# Patient Record
Sex: Female | Born: 1990 | Race: White | Hispanic: No | Marital: Single | Smoking: Never smoker
Health system: Southern US, Community
[De-identification: ages and names within clinical notes are randomized; demographics above are authoritative.]

## PROBLEM LIST (undated history)

## (undated) DIAGNOSIS — M419 Scoliosis, unspecified: Secondary | ICD-10-CM

---

## 2018-10-28 ENCOUNTER — Emergency Department (HOSPITAL_BASED_OUTPATIENT_CLINIC_OR_DEPARTMENT_OTHER): Payer: No Typology Code available for payment source

## 2018-10-28 ENCOUNTER — Emergency Department (HOSPITAL_BASED_OUTPATIENT_CLINIC_OR_DEPARTMENT_OTHER)
Admission: EM | Admit: 2018-10-28 | Discharge: 2018-10-28 | Disposition: A | Discharge status: Home / Self Care | Payer: No Typology Code available for payment source | Attending: Emergency Medicine | Admitting: Emergency Medicine

## 2018-10-28 ENCOUNTER — Encounter (HOSPITAL_BASED_OUTPATIENT_CLINIC_OR_DEPARTMENT_OTHER): Payer: Self-pay

## 2018-10-28 ENCOUNTER — Other Ambulatory Visit: Payer: Self-pay

## 2018-10-28 DIAGNOSIS — Y999 Unspecified external cause status: Secondary | ICD-10-CM | POA: Diagnosis not present

## 2018-10-28 DIAGNOSIS — Y92410 Unspecified street and highway as the place of occurrence of the external cause: Secondary | ICD-10-CM | POA: Insufficient documentation

## 2018-10-28 DIAGNOSIS — S199XXA Unspecified injury of neck, initial encounter: Secondary | ICD-10-CM | POA: Diagnosis present

## 2018-10-28 DIAGNOSIS — Y939 Activity, unspecified: Secondary | ICD-10-CM | POA: Insufficient documentation

## 2018-10-28 DIAGNOSIS — S29012A Strain of muscle and tendon of back wall of thorax, initial encounter: Secondary | ICD-10-CM | POA: Diagnosis not present

## 2018-10-28 DIAGNOSIS — S161XXA Strain of muscle, fascia and tendon at neck level, initial encounter: Secondary | ICD-10-CM | POA: Diagnosis not present

## 2018-10-28 HISTORY — DX: Scoliosis, unspecified: M41.9

## 2018-10-28 MED ORDER — CYCLOBENZAPRINE HCL 10 MG PO TABS: 10.0000 mg | ORAL_TABLET | Freq: Two times a day (BID) | ORAL | 0 refills | Status: AC | PRN

## 2018-10-28 MED ORDER — CYCLOBENZAPRINE HCL 10 MG PO TABS
10.0000 mg | ORAL_TABLET | Freq: Once | ORAL | Status: AC
  Administered 2018-10-28: 20:00:00 10 mg via ORAL
  Filled 2018-10-28: qty 1

## 2018-10-28 MED ORDER — KETOROLAC TROMETHAMINE 60 MG/2ML IM SOLN
60.0000 mg | Freq: Once | INTRAMUSCULAR | Status: AC
  Administered 2018-10-28: 60 mg via INTRAMUSCULAR
  Filled 2018-10-28: qty 2
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
## 2019-10-10 ENCOUNTER — Other Ambulatory Visit: Payer: Self-pay

## 2019-10-10 ENCOUNTER — Emergency Department (HOSPITAL_BASED_OUTPATIENT_CLINIC_OR_DEPARTMENT_OTHER): Payer: Self-pay

## 2019-10-10 ENCOUNTER — Encounter (HOSPITAL_BASED_OUTPATIENT_CLINIC_OR_DEPARTMENT_OTHER): Payer: Self-pay | Admitting: *Deleted

## 2019-10-10 ENCOUNTER — Emergency Department (HOSPITAL_BASED_OUTPATIENT_CLINIC_OR_DEPARTMENT_OTHER)
Admission: EM | Admit: 2019-10-10 | Discharge: 2019-10-10 | Disposition: A | Discharge status: Home / Self Care | Payer: Self-pay | Attending: Emergency Medicine | Admitting: Emergency Medicine

## 2019-10-10 DIAGNOSIS — R519 Headache, unspecified: Secondary | ICD-10-CM | POA: Insufficient documentation

## 2019-10-10 DIAGNOSIS — R202 Paresthesia of skin: Secondary | ICD-10-CM | POA: Insufficient documentation

## 2019-10-10 DIAGNOSIS — N3001 Acute cystitis with hematuria: Secondary | ICD-10-CM | POA: Insufficient documentation

## 2019-10-10 DIAGNOSIS — H538 Other visual disturbances: Secondary | ICD-10-CM | POA: Insufficient documentation

## 2019-10-10 LAB — BASIC METABOLIC PANEL
Anion gap: 12 (ref 5–15)
BUN: 9 mg/dL (ref 6–20)
CO2: 22 mmol/L (ref 22–32)
Calcium: 9.4 mg/dL (ref 8.9–10.3)
Chloride: 101 mmol/L (ref 98–111)
Creatinine, Ser: 0.91 mg/dL (ref 0.44–1.00)
GFR, Estimated: >60 mL/min (ref >60)
Glucose, Bld: 103 mg/dL — ABNORMAL HIGH (ref 70–99)
Potassium: 3.9 mmol/L (ref 3.5–5.1)
Sodium: 135 mmol/L (ref 135–145)

## 2019-10-10 LAB — CBC WITH DIFFERENTIAL/PLATELET
Abs Immature Granulocytes: 0.08 10*3/uL — ABNORMAL HIGH (ref 0.00–0.07)
Basophils Absolute: 0.1 10*3/uL (ref 0.0–0.1)
Basophils Relative: 0 %
Eosinophils Absolute: 0 10*3/uL (ref 0.0–0.5)
Eosinophils Relative: 0 %
HCT: 45.7 % (ref 36.0–46.0)
Hemoglobin: 15.2 g/dL — ABNORMAL HIGH (ref 12.0–15.0)
Immature Granulocytes: 0 %
Lymphocytes Relative: 7 %
Lymphs Abs: 1.3 10*3/uL (ref 0.7–4.0)
MCH: 28.6 pg (ref 26.0–34.0)
MCHC: 33.3 g/dL (ref 30.0–36.0)
MCV: 85.9 fL (ref 80.0–100.0)
Monocytes Absolute: 0.6 10*3/uL (ref 0.1–1.0)
Monocytes Relative: 3 %
Neutro Abs: 16.9 10*3/uL — ABNORMAL HIGH (ref 1.7–7.7)
Neutrophils Relative %: 90 %
Platelets: 312 10*3/uL (ref 150–400)
RBC: 5.32 MIL/uL — ABNORMAL HIGH (ref 3.87–5.11)
RDW: 12.5 % (ref 11.5–15.5)
WBC: 19 10*3/uL — ABNORMAL HIGH (ref 4.0–10.5)
nRBC: 0 % (ref 0.0–0.2)

## 2019-10-10 LAB — URINALYSIS, ROUTINE W REFLEX MICROSCOPIC
Bilirubin Urine: NEGATIVE
Glucose, UA: NEGATIVE mg/dL
Ketones, ur: 15 mg/dL — AB
Nitrite: NEGATIVE
Protein, ur: NEGATIVE mg/dL
Specific Gravity, Urine: 1.015 (ref 1.005–1.030)
pH: 6 (ref 5.0–8.0)

## 2019-10-10 LAB — PREGNANCY, URINE: Preg Test, Ur: NEGATIVE

## 2019-10-10 LAB — URINALYSIS, MICROSCOPIC (REFLEX)

## 2019-10-10 MED ORDER — DIPHENHYDRAMINE HCL 50 MG/ML IJ SOLN
50.0000 mg | Freq: Once | INTRAMUSCULAR | Status: AC
  Administered 2019-10-10: 50 mg via INTRAVENOUS
  Filled 2019-10-10: qty 1

## 2019-10-10 MED ORDER — ONDANSETRON 4 MG PO TBDP: 4.0000 mg | ORAL_TABLET | Freq: Three times a day (TID) | ORAL | 0 refills | Status: DC | PRN

## 2019-10-10 MED ORDER — ONDANSETRON HCL 4 MG/2ML IJ SOLN
4.0000 mg | Freq: Once | INTRAMUSCULAR | Status: AC
  Administered 2019-10-10: 4 mg via INTRAVENOUS
  Filled 2019-10-10: qty 2

## 2019-10-10 MED ORDER — KETOROLAC TROMETHAMINE 30 MG/ML IJ SOLN
30.0000 mg | Freq: Once | INTRAMUSCULAR | Status: AC
  Administered 2019-10-10: 30 mg via INTRAVENOUS
  Filled 2019-10-10: qty 1

## 2019-10-10 MED ORDER — CEPHALEXIN 500 MG PO CAPS: 500.0000 mg | ORAL_CAPSULE | Freq: Two times a day (BID) | ORAL | 0 refills | Status: AC

## 2019-10-10 MED ORDER — PROCHLORPERAZINE EDISYLATE 10 MG/2ML IJ SOLN
10.0000 mg | Freq: Once | INTRAMUSCULAR | Status: AC
  Administered 2019-10-10: 10 mg via INTRAVENOUS
  Filled 2019-10-10: qty 2

## 2019-10-10 MED ORDER — SODIUM CHLORIDE 0.9 % IV BOLUS
1000.0000 mL | Freq: Once | INTRAVENOUS | Status: AC
  Administered 2019-10-10: 1000 mL via INTRAVENOUS

## 2019-10-10 NOTE — ED Triage Notes (Signed)
C/o migraine and UTI

## 2019-10-10 NOTE — Discharge Instructions (Addendum)
Please pick up medication and take as prescribed. We have sent your urine for culture and if the antibiotic is not appropriate we will change it in 2-3 days.   I have also prescribed nausea medication for you to take as needed.   Follow up with your PCP and have your urine rechecked in 1-2 weeks after you have finished the antibiotic.   Return to the ED IMMEDIATELY for any worsening symptoms including worsening headache, confusion, neck stiffness, rash, fevers > 100.4, abdominal pain, excessive vomiting, inability to urinate, or any other new/concerning symptoms.

## 2019-10-10 NOTE — ED Provider Notes (Signed)
[facility] EMERGENCY DEPARTMENT Provider Note   CSN: [id number] Arrival date & time: 10/10/19  1700     History Chief Complaint  Patient presents with  . Migraine    Allison Velazquez is a 29 y.o. female who presents to the ED today with complaint of dysuria, urinary frequency, and urgency that started yesterday. Pt reports she noticed when she wiped she was also having some blood however no vaginal discharge or blood in her underwear not when wiping. She began having a typical migraine headache for her today which includes blurry vision and perioral numbness. She normally takes an excedrin however she began having multiple episodes of NBNB emesis today and could not keep anything down prompting her to come to the ED. Pt states she will sometimes have vomiting with her migraines however it is atypical. She was unsure if the vomiting was related to her UTI symptoms or from the headache prompting her to come to the ED. She does report hx of frequent UTIs and states this feels similar. She is sexually active with 1 female partner; she is not concerned about STIs. No vaginal discharge. Pt denies fevers, chills, neck stiffness, rash, confusion, speech changes, or any other associated symptoms. She has been having some diffuse lower back pain however assumed it was from slouching at her job.   The history is provided by the patient and medical records.       Past Medical History:  Diagnosis Date  . Scoliosis     There are no problems to display for this patient.   Past Surgical History:  Procedure Laterality Date  . CESAREAN SECTION       OB History   No obstetric history on file.     No family history on file.  Social History   Tobacco Use  . Smoking status: Never Smoker  . Smokeless tobacco: Never Used  Vaping Use  . Vaping Use: Never used  Substance Use Topics  . Alcohol use: Never  . Drug use: Never    Home Medications Prior to Admission medications     Medication Sig Start Date End Date Taking? Authorizing Provider  cephALEXin (KEFLEX) 500 MG capsule Take 1 capsule (500 mg total) by mouth 2 (two) times daily for 5 days. 10/10/19 10/15/19  Tanda RockersVenter, Fredrica Capano, PA-C  cyclobenzaprine (FLEXERIL) 10 MG tablet Take 1 tablet (10 mg total) by mouth 2 (two) times daily as needed for muscle spasms. 10/28/18   Alvira MondaySchlossman, Erin, MD  ondansetron (ZOFRAN ODT) 4 MG disintegrating tablet Take 1 tablet (4 mg total) by mouth every 8 (eight) hours as needed for nausea or vomiting. 10/10/19   Tanda RockersVenter, Besan Ketchem, PA-C    Allergies    Patient has no known allergies.  Review of Systems   Review of Systems  Constitutional: Negative for chills and fever.  Eyes: Positive for photophobia and visual disturbance.  Gastrointestinal: Positive for nausea and vomiting. Negative for abdominal pain, constipation and diarrhea.  Genitourinary: Positive for dysuria, hematuria and urgency. Negative for flank pain, vaginal bleeding and vaginal pain.  Musculoskeletal: Negative for neck pain and neck stiffness.  Skin: Negative for rash.  Neurological: Positive for headaches. Negative for speech difficulty, weakness and numbness.  Psychiatric/Behavioral: Negative for confusion.  All other systems reviewed and are negative.   Physical Exam Updated Vital Signs BP (!) 144/94   Pulse 75   Temp 98 F (36.7 C) (Oral)   Resp 18   Ht 5\' 7"  (1.702 m)   Wt 81.6  kg   LMP 10/08/2019   SpO2 100%   BMI 28.19 kg/m   Physical Exam Vitals and nursing note reviewed.  Constitutional:      Appearance: She is not ill-appearing or diaphoretic.  HENT:     Head: Normocephalic and atraumatic.  Eyes:     Extraocular Movements: Extraocular movements intact.     Conjunctiva/sclera: Conjunctivae normal.     Pupils: Pupils are equal, round, and reactive to light.  Neck:     Meningeal: Brudzinski's sign and Kernig's sign absent.  Cardiovascular:     Rate and Rhythm: Normal rate and regular  rhythm.     Pulses: Normal pulses.  Pulmonary:     Effort: Pulmonary effort is normal.     Breath sounds: Normal breath sounds. No wheezing, rhonchi or rales.  Abdominal:     Palpations: Abdomen is soft.     Tenderness: There is no abdominal tenderness. There is no right CVA tenderness, left CVA tenderness, guarding or rebound.  Musculoskeletal:     Cervical back: Neck supple.     Comments: Diffuse paralumbar musculature TTP. No midline TTP.   Skin:    General: Skin is warm and dry.  Neurological:     Mental Status: She is alert.     Comments: CN 3-12 grossly intact A&O x4 GCS 15 Sensation and strength intact Gait nonataxic including with tandem walking Coordination with finger-to-nose WNL Neg romberg, neg pronator drift     ED Results / Procedures / Treatments   Labs (all labs ordered are listed, but only abnormal results are displayed) Labs Reviewed  URINALYSIS, ROUTINE W REFLEX MICROSCOPIC - Abnormal; Notable for the following components:      Result Value   Hgb urine dipstick MODERATE (*)    Ketones, ur 15 (*)    Leukocytes,Ua SMALL (*)    All other components within normal limits  BASIC METABOLIC PANEL - Abnormal; Notable for the following components:   Glucose, Bld 103 (*)    All other components within normal limits  CBC WITH DIFFERENTIAL/PLATELET - Abnormal; Notable for the following components:   WBC 19.0 (*)    RBC 5.32 (*)    Hemoglobin 15.2 (*)    Neutro Abs 16.9 (*)    Abs Immature Granulocytes 0.08 (*)    All other components within normal limits  URINALYSIS, MICROSCOPIC (REFLEX) - Abnormal; Notable for the following components:   Bacteria, UA FEW (*)    All other components within normal limits  URINE CULTURE  PREGNANCY, URINE    EKG None  Radiology CT Renal Stone Study  Result Date: 10/10/2019 CLINICAL DATA:  Urinary tract stone. EXAM: CT ABDOMEN AND PELVIS WITHOUT CONTRAST TECHNIQUE: Multidetector CT imaging of the abdomen and pelvis was  performed following the standard protocol without IV contrast. COMPARISON:  CT dated January 26, 2012. FINDINGS: Lower chest: The lung bases are clear. The heart size is normal. Hepatobiliary: The liver is normal. Normal gallbladder.There is no biliary ductal dilation. Pancreas: Normal contours without ductal dilatation. No peripancreatic fluid collection. Spleen: Unremarkable. Adrenals/Urinary Tract: --Adrenal glands: Unremarkable. --Right kidney/ureter: No hydronephrosis or radiopaque kidney stones. --Left kidney/ureter: No hydronephrosis or radiopaque kidney stones. --Urinary bladder: Unremarkable. Stomach/Bowel: --Stomach/Duodenum: No hiatal hernia or other gastric abnormality. Normal duodenal course and caliber. --Small bowel: Unremarkable. --Colon: Unremarkable. --Appendix: Normal. Vascular/Lymphatic: Normal course and caliber of the major abdominal vessels. --No retroperitoneal lymphadenopathy. --No mesenteric lymphadenopathy. --No pelvic or inguinal lymphadenopathy. Reproductive: Unremarkable Other: No ascites or free air. The abdominal wall  is normal. Musculoskeletal. No acute displaced fractures. IMPRESSION: No acute abdominopelvic abnormality. Electronically Signed   By: Katherine Mantle M.D.   On: 10/10/2019 19:30    Procedures Procedures (including critical care time)  Medications Ordered in ED Medications  sodium chloride 0.9 % bolus 1,000 mL (0 mLs Intravenous Stopped 10/10/19 2033)  ondansetron (ZOFRAN) injection 4 mg (4 mg Intravenous Given 10/10/19 1844)  diphenhydrAMINE (BENADRYL) injection 50 mg (50 mg Intravenous Given 10/10/19 1844)  prochlorperazine (COMPAZINE) injection 10 mg (10 mg Intravenous Given 10/10/19 1845)  ketorolac (TORADOL) 30 MG/ML injection 30 mg (30 mg Intravenous Given 10/10/19 1934)    ED Course  I have reviewed the triage vital signs and the nursing notes.  Pertinent labs & imaging results that were available during my care of the patient were reviewed by me  and considered in my medical decision making (see chart for details).  Clinical Course as of Oct 09 2056  [facility] Oct 10, 2019  1855 WBC(!): 19.0 [MV]  1902 Glori Luis): SMALL [MV]  1902 Hgb urine dipstickMarland Kitchen): MODERATE [MV]  1902 WBC, UA: 11-20 [MV]  1902 RBC / HPF: 11-20 [MV]    Clinical Course User Index [MV] Tanda Rockers, PA-C   MDM Rules/Calculators/A&P                          29 year old female presents to the ED with urinary complaints including dysuria, frequency, urgency for the past day as well as hematuria.  Began having a migraine headache today as well as nausea and vomiting.  Reports vomiting is not very typical for her migraine headaches.  On arrival to the ED patient is afebrile, nontachycardic nontachypneic.  She appears to be in no acute distress.  She has no focal abdominal tenderness palpation on exam.  There is no CVA tenderness.  She has been having some lower back pain for the past several days however attributed to not sitting correctly at work for prolonged periods of time.  She does have diffuse paralumbar musculature tenderness palpation.  Her neuro exam is very reassuring without meningeal signs or focal neuro deficits.  She states this feels like her typical migraine headaches however she does not typically have vomiting, she was unsure if this was related to her UTI-like symptoms.  She is sexually active with one female partner however she is not concerned about STIs and denies pelvic pain.  At this time we will plan for lab work including CBC and BMP to assess for kidney function.  Will check UA and urine pregnancy.  We will plan for headache cocktail as well.  We will continue to monitor.  Plan to fluid challenge prior to discharge.  CBC noted to have a leukocytosis of nineteen thousand with a concentrated hemoglobin of 15.2 and a white blood cell count of 5.32.  BMP with a glucose 103 stable creatinine of 0.91.  No other findings. UA with red hemoglobin, small  leuks, 11-20 white blood cells and red blood cells per high-power field.  Patient reports she finished her menstrual cycle 4 days ago.  She does report she had noticed some hematuria with wiping.  Urine pregnancy test negative.  Urinalysis with concern given hematuria.  Given vomiting in the setting of urinary symptoms we will plan for CT renal stone study at this time. \  CT renal stone study without acute abnormality.   On reevaluation pt resting comfortably in darkened room. Easily arousable. Reports significant improvement in HA. Will  fluid challenge at this time and plan to dispo. Will treat for UTI.   Pt able to tolerate fluids without difficulty. Discharged home at this time with PCP follow up. Strict return precautions discussed. Pt is in agreement with plan and stable for discharge home.   This note was prepared using Dragon voice recognition software and may include unintentional dictation errors due to the inherent limitations of voice recognition software.  Final Clinical Impression(s) / ED Diagnoses Final diagnoses:  Bad headache  Acute cystitis with hematuria    Rx / DC Orders ED Discharge Orders         Ordered    cephALEXin (KEFLEX) 500 MG capsule  2 times daily        10/10/19 2043    ondansetron (ZOFRAN ODT) 4 MG disintegrating tablet  Every 8 hours PRN        10/10/19 2044           Discharge Instructions     Please pick up medication and take as prescribed. We have sent your urine for culture and if the antibiotic is not appropriate we will change it in 2-3 days.   I have also prescribed nausea medication for you to take as needed.   Follow up with your PCP and have your urine rechecked in 1-2 weeks after you have finished the antibiotic.   Return to the ED IMMEDIATELY for any worsening symptoms including worsening headache, confusion, neck stiffness, rash, fevers > 100.4, abdominal pain, excessive vomiting, inability to urinate, or any other new/concerning  symptoms.        Tanda Rockers, PA-C 10/10/19 3716    Tilden Fossa, MD 10/10/19 2350

## 2019-10-12 LAB — URINE CULTURE: Culture: NO GROWTH

## 2020-03-04 ENCOUNTER — Encounter (HOSPITAL_BASED_OUTPATIENT_CLINIC_OR_DEPARTMENT_OTHER): Payer: Self-pay | Admitting: *Deleted

## 2020-03-04 ENCOUNTER — Other Ambulatory Visit: Payer: Self-pay

## 2020-03-04 ENCOUNTER — Emergency Department (HOSPITAL_BASED_OUTPATIENT_CLINIC_OR_DEPARTMENT_OTHER)
Admission: EM | Admit: 2020-03-04 | Discharge: 2020-03-04 | Disposition: A | Discharge status: Home / Self Care | Payer: Self-pay | Attending: Emergency Medicine | Admitting: Emergency Medicine

## 2020-03-04 DIAGNOSIS — R6883 Chills (without fever): Secondary | ICD-10-CM | POA: Insufficient documentation

## 2020-03-04 DIAGNOSIS — R112 Nausea with vomiting, unspecified: Secondary | ICD-10-CM | POA: Insufficient documentation

## 2020-03-04 DIAGNOSIS — R519 Headache, unspecified: Secondary | ICD-10-CM | POA: Insufficient documentation

## 2020-03-04 DIAGNOSIS — R197 Diarrhea, unspecified: Secondary | ICD-10-CM | POA: Insufficient documentation

## 2020-03-04 LAB — URINALYSIS, ROUTINE W REFLEX MICROSCOPIC
Bilirubin Urine: NEGATIVE
Glucose, UA: NEGATIVE mg/dL
Ketones, ur: NEGATIVE mg/dL
Leukocytes,Ua: NEGATIVE
Nitrite: NEGATIVE
Protein, ur: NEGATIVE mg/dL
Specific Gravity, Urine: 1.025 (ref 1.005–1.030)
pH: 7 (ref 5.0–8.0)

## 2020-03-04 LAB — CBC WITH DIFFERENTIAL/PLATELET
Abs Immature Granulocytes: 0.02 10*3/uL (ref 0.00–0.07)
Basophils Absolute: 0 10*3/uL (ref 0.0–0.1)
Basophils Relative: 0 %
Eosinophils Absolute: 0 10*3/uL (ref 0.0–0.5)
Eosinophils Relative: 0 %
HCT: 45.4 % (ref 36.0–46.0)
Hemoglobin: 15.3 g/dL — ABNORMAL HIGH (ref 12.0–15.0)
Immature Granulocytes: 0 %
Lymphocytes Relative: 13 %
Lymphs Abs: 1.5 10*3/uL (ref 0.7–4.0)
MCH: 28.6 pg (ref 26.0–34.0)
MCHC: 33.7 g/dL (ref 30.0–36.0)
MCV: 84.9 fL (ref 80.0–100.0)
Monocytes Absolute: 0.6 10*3/uL (ref 0.1–1.0)
Monocytes Relative: 6 %
Neutro Abs: 9 10*3/uL — ABNORMAL HIGH (ref 1.7–7.7)
Neutrophils Relative %: 81 %
Platelets: 305 10*3/uL (ref 150–400)
RBC: 5.35 MIL/uL — ABNORMAL HIGH (ref 3.87–5.11)
RDW: 12.4 % (ref 11.5–15.5)
WBC: 11.2 10*3/uL — ABNORMAL HIGH (ref 4.0–10.5)
nRBC: 0 % (ref 0.0–0.2)

## 2020-03-04 LAB — COMPREHENSIVE METABOLIC PANEL
ALT: 25 U/L (ref 0–44)
AST: 19 U/L (ref 15–41)
Albumin: 4.7 g/dL (ref 3.5–5.0)
Alkaline Phosphatase: 54 U/L (ref 38–126)
Anion gap: 12 (ref 5–15)
BUN: 9 mg/dL (ref 6–20)
CO2: 25 mmol/L (ref 22–32)
Calcium: 9.6 mg/dL (ref 8.9–10.3)
Chloride: 101 mmol/L (ref 98–111)
Creatinine, Ser: 1.07 mg/dL — ABNORMAL HIGH (ref 0.44–1.00)
GFR, Estimated: >60 mL/min (ref >60)
Glucose, Bld: 98 mg/dL (ref 70–99)
Potassium: 4.3 mmol/L (ref 3.5–5.1)
Sodium: 138 mmol/L (ref 135–145)
Total Bilirubin: 0.7 mg/dL (ref 0.3–1.2)
Total Protein: 8.3 g/dL — ABNORMAL HIGH (ref 6.5–8.1)

## 2020-03-04 LAB — LIPASE, BLOOD: Lipase: 27 U/L (ref 11–51)

## 2020-03-04 LAB — URINALYSIS, MICROSCOPIC (REFLEX): WBC, UA: NONE SEEN WBC/hpf (ref 0–5)

## 2020-03-04 LAB — PREGNANCY, URINE: Preg Test, Ur: NEGATIVE

## 2020-03-04 MED ORDER — ONDANSETRON 4 MG PO TBDP: 4.0000 mg | ORAL_TABLET | Freq: Three times a day (TID) | ORAL | 0 refills | Status: AC | PRN

## 2020-03-04 MED ORDER — ONDANSETRON 4 MG PO TBDP
4.0000 mg | ORAL_TABLET | Freq: Once | ORAL | Status: AC
  Administered 2020-03-04: 4 mg via ORAL
  Filled 2020-03-04: qty 1

## 2020-03-04 NOTE — Discharge Instructions (Addendum)
You came to the emergency department today to be evaluated for your nausea, vomiting, and diarrhea. Your physical exam and lab results were reassuring. Symptoms are likely due to a viral GI illness and will improve with time. Your lab work showed that your creatinine was slightly elevated this could be due to dehydration. Please make sure to adequately hydrate and follow-up with your primary care provider for repeat laboratory testing.  Get help right away if you: Have chest pain. Feel extremely weak or you faint. See blood in your vomit. Have vomit that looks like coffee grounds. Have bloody or black stools or stools that look like tar. Have a severe headache, a stiff neck, or both. Have a rash. Have severe pain, cramping, or bloating in your abdomen. Have trouble breathing or you are breathing very quickly. Have a fast heartbeat. Have skin that feels cold and clammy. Feel confused. Have pain when you urinate. Have signs of dehydration, such as: Dark urine, very little urine, or no urine. Cracked lips. Dry mouth. Sunken eyes. Sleepiness. Weakness.

## 2020-03-04 NOTE — ED Triage Notes (Signed)
She woke at 3am with diarrhea and vomiting.

## 2020-03-04 NOTE — ED Provider Notes (Signed)
[facility] EMERGENCY DEPARTMENT Provider Note   CSN: [id number] Arrival date & time: 03/04/20  1715     History Chief Complaint  Patient presents with  . Emesis  . Diarrhea    Allison Velazquez is a 30 y.o. female with no past pertinent medical history.  Patient presents with chief complaint of nausea, vomiting, and diarrhea.  Patient reports that her symptoms began 0300 this morning.  She reports that she has vomited 3 times since her symptoms began.  Patient reports emesis stools food contents and bilious.  Patient denies any bloody emesis or coffee-ground emesis.  Patient endorses watery stools.  Denies any melena or blood in stool.  Patient denies any abdominal pain.  Patient endorses chills, and intermittent headache throughout the day.  Patient reports that her headache is located pressure forehead, comes and goes gradually, not the worst headache of her life, no change with exertion.    She states that she has had nonproductive cough over the last month.  Patient denies any COVID-19 vaccination.  Denies any known sick contacts COVID-19.  Patient denies any GU complaints, lightheadedness, dizziness, syncopal episodes, neck pain, back pain, numbness to extremities, weakness to extremities.  Patient reports that her daughter was sick with a viral GI illness last week.    LMP 2 weeks prior.  G1 P1001.  Delivery was via cesarean section.  Patient denies any other abdominal surgeries.  Patient denies any alcohol use, or illicit drug use.  HPI     Past Medical History:  Diagnosis Date  . Scoliosis     There are no problems to display for this patient.   Past Surgical History:  Procedure Laterality Date  . CESAREAN SECTION       OB History   No obstetric history on file.     No family history on file.  Social History   Tobacco Use  . Smoking status: Never Smoker  . Smokeless tobacco: Never Used  Vaping Use  . Vaping Use: Never used  Substance Use Topics   . Alcohol use: Never  . Drug use: Never    Home Medications Prior to Admission medications   Medication Sig Start Date End Date Taking? Authorizing Provider  cyclobenzaprine (FLEXERIL) 10 MG tablet Take 1 tablet (10 mg total) by mouth 2 (two) times daily as needed for muscle spasms. 10/28/18   Alvira Monday, MD  ondansetron (ZOFRAN ODT) 4 MG disintegrating tablet Take 1 tablet (4 mg total) by mouth every 8 (eight) hours as needed for nausea or vomiting. 10/10/19   Tanda Rockers, PA-C    Allergies    Patient has no known allergies.  Review of Systems   Review of Systems  Constitutional: Positive for chills. Negative for fever.  HENT: Negative for congestion, rhinorrhea and sore throat.   Eyes: Negative for visual disturbance.  Respiratory: Positive for cough. Negative for shortness of breath.   Cardiovascular: Negative for chest pain and leg swelling.  Gastrointestinal: Positive for diarrhea, nausea and vomiting. Negative for abdominal distention, abdominal pain, anal bleeding, blood in stool, constipation and rectal pain.  Genitourinary: Negative for decreased urine volume, difficulty urinating, dysuria, flank pain, frequency, genital sores, hematuria, menstrual problem, pelvic pain, urgency, vaginal bleeding, vaginal discharge and vaginal pain.  Musculoskeletal: Negative for back pain, neck pain and neck stiffness.  Skin: Negative for color change and rash.  Neurological: Positive for headaches. Negative for dizziness, tremors, seizures, syncope, facial asymmetry, speech difficulty, weakness, light-headedness and numbness.  Psychiatric/Behavioral: Negative for  confusion.    Physical Exam Updated Vital Signs BP (!) 146/101 (BP Location: Right Arm)   Pulse 89   Temp 98.2 F (36.8 C) (Oral)   Resp 14   Ht 5\' 7"  (1.702 m)   Wt 84.6 kg   SpO2 99%   BMI 29.21 kg/m   Physical Exam Vitals and nursing note reviewed.  Constitutional:      General: She is not in acute  distress.    Appearance: She is not ill-appearing, toxic-appearing or diaphoretic.  HENT:     Head: Normocephalic.  Eyes:     General: No scleral icterus.       Right eye: No discharge.        Left eye: No discharge.  Cardiovascular:     Rate and Rhythm: Normal rate.     Heart sounds: Normal heart sounds.  Pulmonary:     Effort: Pulmonary effort is normal. No respiratory distress.     Breath sounds: Normal breath sounds. No stridor. No wheezing, rhonchi or rales.  Abdominal:     General: Bowel sounds are normal. There is no distension. There are no signs of injury.     Palpations: Abdomen is soft. There is no mass or pulsatile mass.     Tenderness: There is no abdominal tenderness. There is no guarding or rebound. Negative signs include McBurney's sign.     Hernia: There is no hernia in the umbilical area or ventral area.  Musculoskeletal:     Cervical back: Normal range of motion and neck supple.     Right lower leg: No swelling, deformity, lacerations, tenderness or bony tenderness. No edema.     Left lower leg: No swelling, deformity, lacerations, tenderness or bony tenderness. No edema.  Skin:    General: Skin is warm and dry.     Coloration: Skin is not jaundiced or pale.  Neurological:     General: No focal deficit present.     Mental Status: She is alert.  Psychiatric:        Behavior: Behavior is cooperative.     ED Results / Procedures / Treatments   Labs (all labs ordered are listed, but only abnormal results are displayed) Labs Reviewed  URINALYSIS, ROUTINE W REFLEX MICROSCOPIC - Abnormal; Notable for the following components:      Result Value   Hgb urine dipstick TRACE (*)    All other components within normal limits  COMPREHENSIVE METABOLIC PANEL - Abnormal; Notable for the following components:   Creatinine, Ser 1.07 (*)    Total Protein 8.3 (*)    All other components within normal limits  CBC WITH DIFFERENTIAL/PLATELET - Abnormal; Notable for the  following components:   WBC 11.2 (*)    RBC 5.35 (*)    Hemoglobin 15.3 (*)    Neutro Abs 9.0 (*)    All other components within normal limits  URINALYSIS, MICROSCOPIC (REFLEX) - Abnormal; Notable for the following components:   Bacteria, UA RARE (*)    All other components within normal limits  PREGNANCY, URINE  LIPASE, BLOOD    EKG None  Radiology No results found.  Procedures Procedures   Medications Ordered in ED Medications  ondansetron (ZOFRAN-ODT) disintegrating tablet 4 mg (4 mg Oral Given 03/04/20 1802)    ED Course  I have reviewed the triage vital signs and the nursing notes.  Pertinent labs & imaging results that were available during my care of the patient were reviewed by me and considered in my medical  decision making (see chart for details).    MDM Rules/Calculators/A&P                          Alert 30 year old female no acute distress, nontoxic-appearing.  Patient presents with chief complaint of nausea, vomiting, diarrhea.  Patient endorses intermittent headache, chills.  Patient denies any fever, GU symptoms, abdominal pain, visual disturbance, numbness or tingling to extremities, weakness in extremities, lightheadedness, dizziness, syncopal episode.  Abdomen soft, nondistended, nontender; normal active bowel sounds in all quadrants.  Patient afebrile.  Urine pregnancy test negative; negative for intrauterine or ectopic  Pregnancy. UA shows bacteria rare; low suspicion for UTI as nitrate negative, leukocytes negative, WBC none seen, and patient has no new complaints. Lipase within normal limits suspicion for acute pancreatitis. Low suspicion for acute pancreatitis as patient has no abdominal tenderness. Slight leukocytosis at 11.2. Creatinine slightly elevated at 1.07; likely due to dehydration. Will counsel patient on increased fluid intake and have her follow-up with primary care provider for repeat testing.  Patient was recently exposed to viral GI  illness from her daughter.  Likely this is the cause of patient's symptoms.  Will prescribe Zofran and have patient follow-up with primary care provider as needed if symptoms do not improve.  Discussed results, findings, treatment and follow up. Patient advised of return precautions. Patient verbalized understanding and agreed with plan.   Final Clinical Impression(s) / ED Diagnoses Final diagnoses:  Nausea vomiting and diarrhea    Rx / DC Orders ED Discharge Orders         Ordered    ondansetron (ZOFRAN ODT) 4 MG disintegrating tablet  Every 8 hours PRN        03/04/20 1916           Berneice Heinrich 03/04/20 1919    Benjiman Core, MD 03/04/20 2356

## 2022-02-09 IMAGING — CT CT RENAL STONE PROTOCOL
2 of 4 series · 17 of 46 positions shown, 19 images · non-contrast
Comparison: CT dated January 26, 2012.

CLINICAL DATA: Urinary tract stone.

EXAM:
CT ABDOMEN AND PELVIS WITHOUT CONTRAST
TECHNIQUE: Multidetector CT imaging of the abdomen and pelvis was performed
following the standard protocol without IV contrast.

[Series 2: axial st · axial · 0.88mm/px · z∈[-426,-11]mm · 14 of 91 slices shown, 16 images]
[im 4/91  soft-tissue]
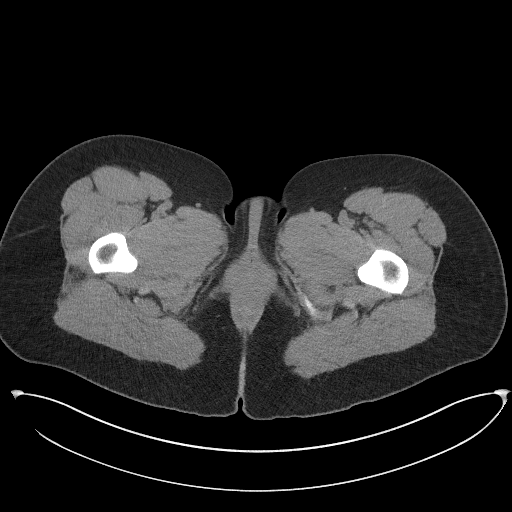
[im 4/91  bone]
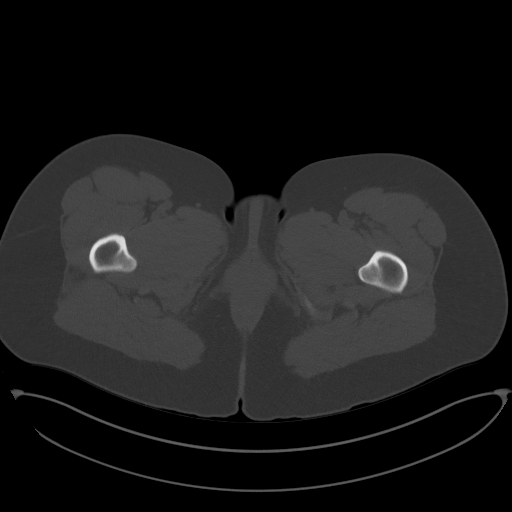
[im 12/91  soft-tissue]
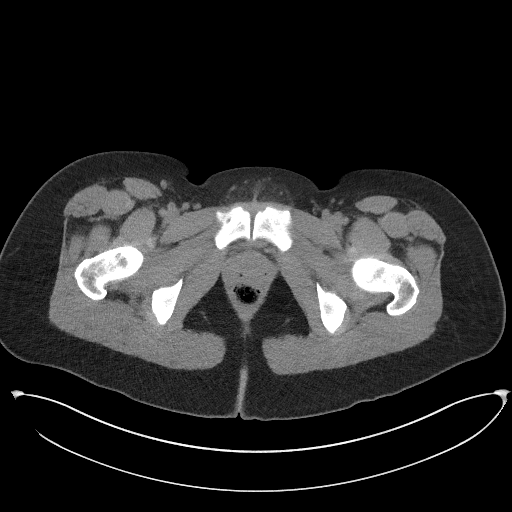
[im 19/91  soft-tissue]
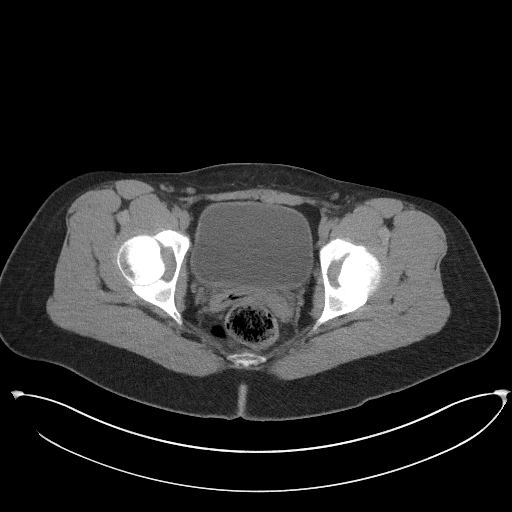
[im 23/91  soft-tissue]
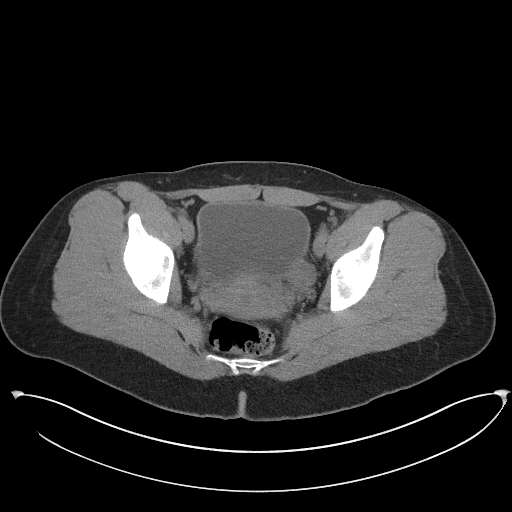
[im 31/91  soft-tissue]
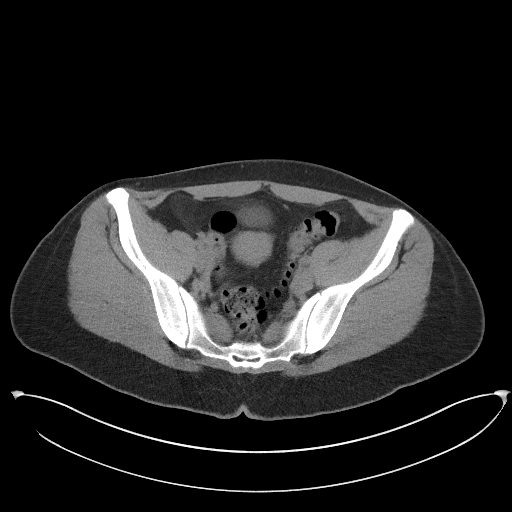
[im 38/91  soft-tissue]
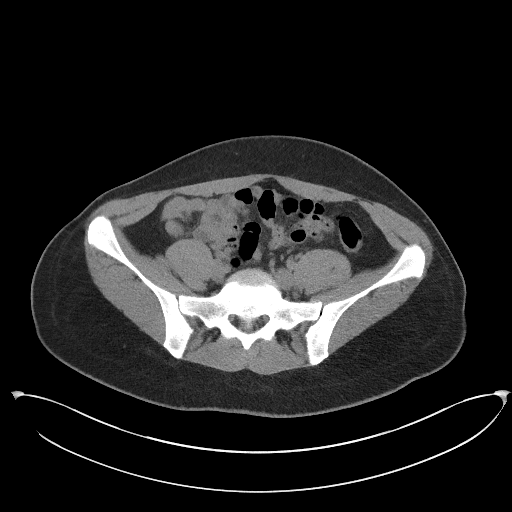
[im 42/91  soft-tissue]
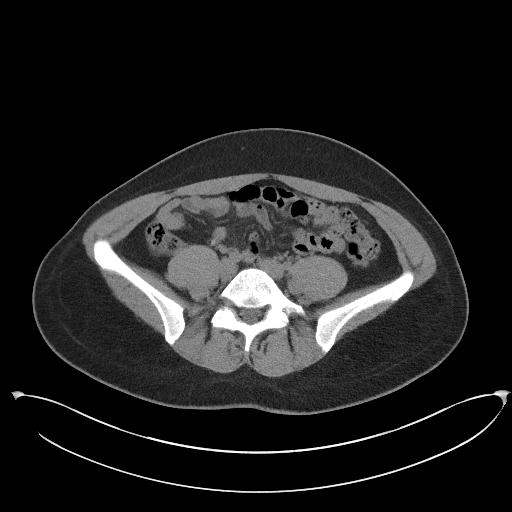
[im 49/91  soft-tissue]
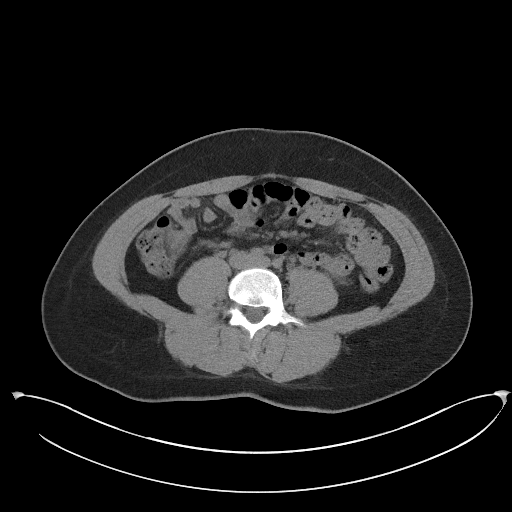
[im 53/91  soft-tissue]
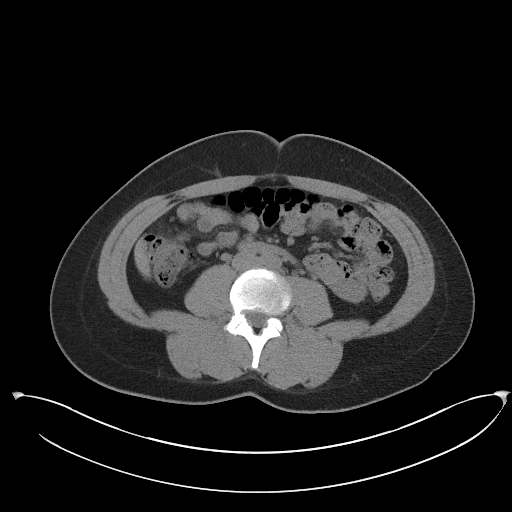
[im 53/91  bone]
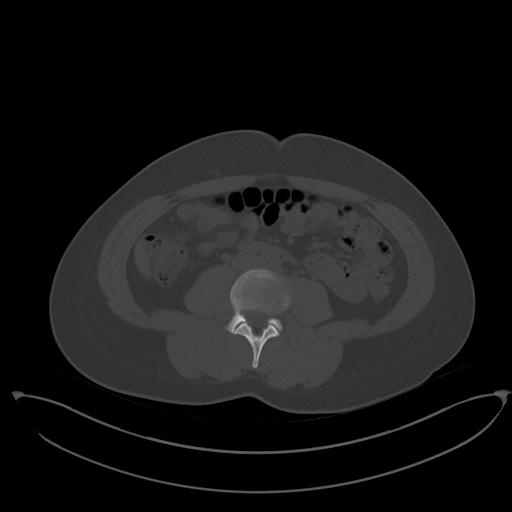
[im 61/91  soft-tissue]
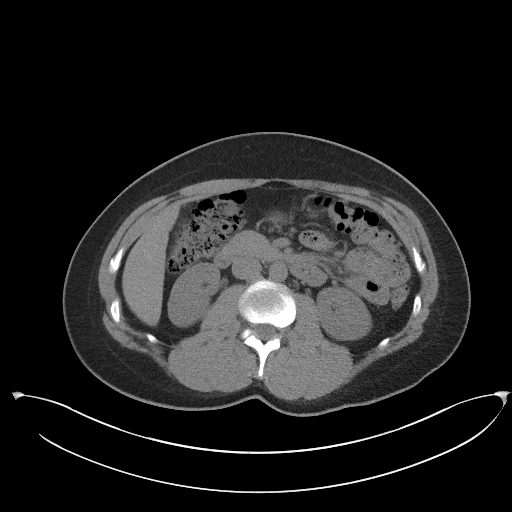
[im 68/91  soft-tissue]
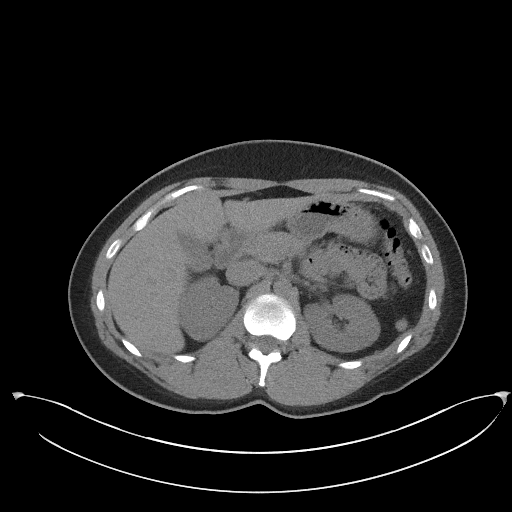
[im 72/91  soft-tissue]
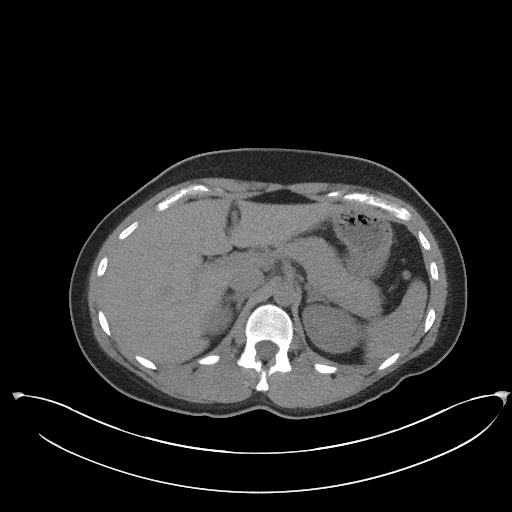
[im 79/91  soft-tissue]
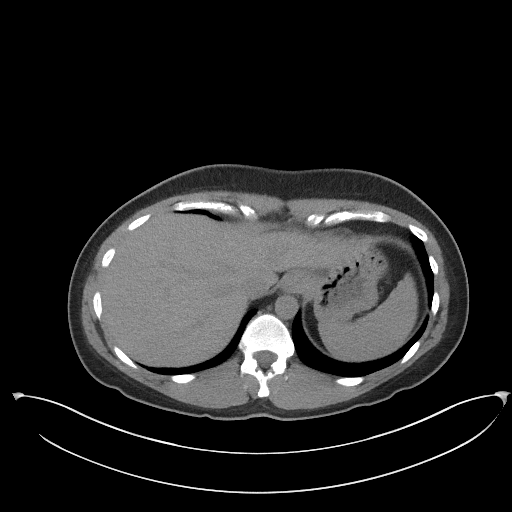
[im 87/91  soft-tissue]
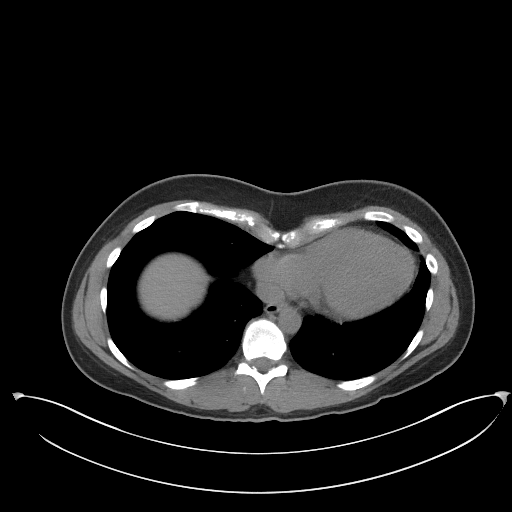

[Series 5: coronal st · coronal · 0.83mm/px · 3 of 92 slices shown]
[im 31/92  soft-tissue]
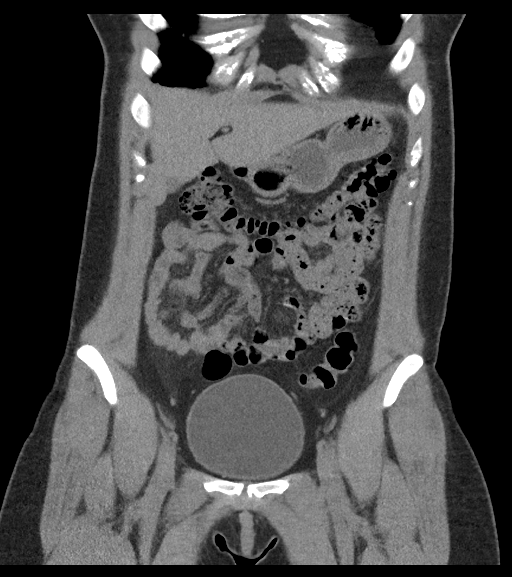
[im 41/92  soft-tissue]
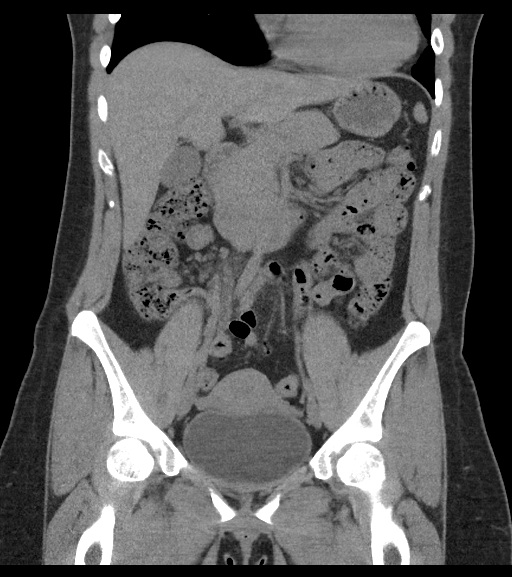
[im 51/92  soft-tissue]
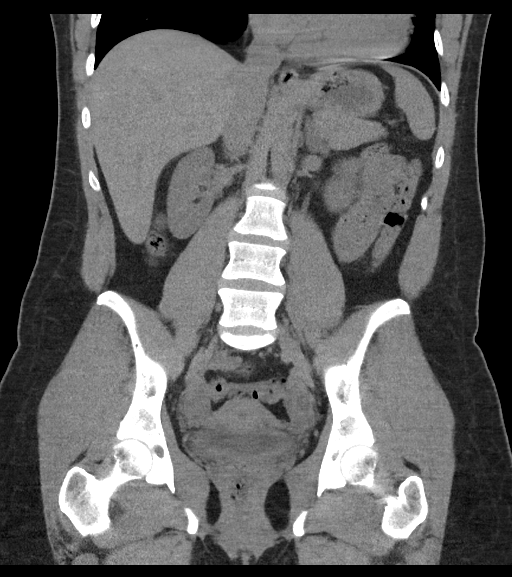

[17 of 46 positions shown; findings below may reference images not displayed]

FINDINGS: Lower chest: The lung bases are clear. The heart size is normal.

Hepatobiliary: The liver is normal. Normal gallbladder.There is no
biliary ductal dilation.

Pancreas: Normal contours without ductal dilatation. No
peripancreatic fluid collection.

Spleen: Unremarkable.

Adrenals/Urinary Tract:

--Adrenal glands: Unremarkable.

--Right kidney/ureter: No hydronephrosis or radiopaque kidney
stones.

--Left kidney/ureter: No hydronephrosis or radiopaque kidney stones.

--Urinary bladder: Unremarkable.

Stomach/Bowel:

--Stomach/Duodenum: No hiatal hernia or other gastric abnormality.
Normal duodenal course and caliber.

--Small bowel: Unremarkable.

--Colon: Unremarkable.

--Appendix: Normal.

Vascular/Lymphatic: Normal course and caliber of the major abdominal
vessels.

--No retroperitoneal lymphadenopathy.

--No mesenteric lymphadenopathy.

--No pelvic or inguinal lymphadenopathy.

Reproductive: Unremarkable

Other: No ascites or free air. The abdominal wall is normal.

Musculoskeletal. No acute displaced fractures.
IMPRESSION: No acute abdominopelvic abnormality.
